# Patient Record
Sex: Male | Born: 1985 | Race: Black or African American | Hispanic: No | Marital: Married | Smoking: Never smoker
Health system: Southern US, Community
[De-identification: ages and names within clinical notes are randomized; demographics above are authoritative.]

## PROBLEM LIST (undated history)

## (undated) DIAGNOSIS — J45909 Unspecified asthma, uncomplicated: Secondary | ICD-10-CM

---

## 2019-11-18 ENCOUNTER — Other Ambulatory Visit: Payer: Self-pay

## 2019-11-18 ENCOUNTER — Emergency Department (HOSPITAL_COMMUNITY)
Admission: EM | Admit: 2019-11-18 | Discharge: 2019-11-18 | Disposition: A | Discharge status: Home / Self Care | Payer: 59 | Attending: Emergency Medicine | Admitting: Emergency Medicine

## 2019-11-18 ENCOUNTER — Emergency Department (HOSPITAL_COMMUNITY): Payer: 59

## 2019-11-18 ENCOUNTER — Encounter (HOSPITAL_COMMUNITY): Payer: Self-pay | Admitting: Emergency Medicine

## 2019-11-18 DIAGNOSIS — J45909 Unspecified asthma, uncomplicated: Secondary | ICD-10-CM | POA: Insufficient documentation

## 2019-11-18 DIAGNOSIS — J069 Acute upper respiratory infection, unspecified: Secondary | ICD-10-CM | POA: Insufficient documentation

## 2019-11-18 DIAGNOSIS — R0981 Nasal congestion: Secondary | ICD-10-CM | POA: Diagnosis present

## 2019-11-18 HISTORY — DX: Unspecified asthma, uncomplicated: J45.909

## 2019-11-18 NOTE — ED Triage Notes (Signed)
C/o runny nose, cough with yellow phlegm, and sore throat x 1 week.

## 2019-11-18 NOTE — Discharge Instructions (Signed)
Your symptoms are likely caused by a viral upper respiratory infection. Antibiotics are not helpful in treating viral infection, the virus should run its course in about 5-7 days. Please make sure you are drinking plenty of fluids. You can treat your symptoms supportively with tylenol/ibuprofen for fevers and pains, Zyrtec and Flonase to help with nasal congestion, and Mucinex or over the counter cough syrups and throat lozenges to help with cough. If your symptoms are not improving please follow up with you Primary doctor.   If you develop persistent fevers, shortness of breath or difficulty breathing, chest pain, severe headache and neck pain, persistent nausea and vomiting or other new or concerning symptoms return to the Emergency department.

## 2019-11-18 NOTE — ED Provider Notes (Signed)
[facility] EMERGENCY DEPARTMENT Provider Note   CSN: [id number] Arrival date & time: 11/18/19  1637     History Chief Complaint  Patient presents with  . Nasal Congestion  . Cough  . Sore Throat    Fadi Menter is a 34 y.o. male.  Demetrius Mahler is a 34 y.o. male with a history of asthma, who presents to the emergency department for evaluation of 1 week of rhinorrhea, nasal congestion, sore throat and cough.  Patient reports cough has been productive of clear to yellow sputum.  Feels that he has nasal drainage.  Sore throat noted as well.  No fevers or chills.  No headache or myalgias.  No chest pain or shortness of breath.  He has not noted any wheezing.  No abdominal pain, nausea or vomiting.  Patient has not had any known sick contacts, states that he has had both of these Covid vaccines, completed vaccinations about 5 weeks ago.  Has been using some over-the-counter medications without much improvement.        Past Medical History:  Diagnosis Date  . Asthma     There are no problems to display for this patient.   History reviewed. No pertinent surgical history.     No family history on file.  Social History   Tobacco Use  . Smoking status: Never Smoker  . Smokeless tobacco: Never Used  Substance Use Topics  . Alcohol use: Not Currently  . Drug use: Not Currently    Home Medications Prior to Admission medications   Not on File    Allergies    Patient has no allergy information on record.  Review of Systems   Review of Systems  Constitutional: Negative for chills and fever.  HENT: Positive for congestion, postnasal drip, rhinorrhea and sore throat.   Respiratory: Positive for cough. Negative for chest tightness, shortness of breath and wheezing.   Cardiovascular: Negative for chest pain.  Gastrointestinal: Negative for abdominal pain, constipation, diarrhea, nausea and vomiting.  Musculoskeletal: Negative for arthralgias and  myalgias.  Skin: Negative for color change and rash.  Neurological: Negative for headaches.    Physical Exam Updated Vital Signs BP (!) 139/94 (BP Location: Left Arm)   Pulse (!) 107   Temp 98.7 F (37.1 C) (Oral)   Resp 20   SpO2 96%   Physical Exam Vitals and nursing note reviewed.  Constitutional:      General: He is not in acute distress.    Appearance: He is well-developed. He is not ill-appearing or diaphoretic.     Comments: Well-appearing and in no distress  HENT:     Head: Normocephalic and atraumatic.     Nose: Congestion and rhinorrhea present.     Mouth/Throat:     Mouth: Mucous membranes are moist.     Pharynx: Oropharynx is clear. No pharyngeal swelling or oropharyngeal exudate.     Tonsils: No tonsillar exudate.     Comments: Posterior oropharynx clear and mucous membranes moist, there is mild erythema but no edema or tonsillar exudates, uvula midline, normal phonation, no trismus, tolerating secretions without difficulty. Eyes:     General:        Right eye: No discharge.        Left eye: No discharge.  Neck:     Comments: No rigidity Cardiovascular:     Rate and Rhythm: Normal rate and regular rhythm.     Heart sounds: Normal heart sounds.  Pulmonary:     Effort:  Pulmonary effort is normal. No respiratory distress.     Breath sounds: Normal breath sounds.     Comments: Respirations equal and unlabored, patient able to speak in full sentences, lungs clear to auscultation bilaterally Abdominal:     General: Bowel sounds are normal. There is no distension.     Palpations: Abdomen is soft. There is no mass.     Tenderness: There is no abdominal tenderness. There is no guarding.     Comments: Abdomen soft, nondistended, nontender to palpation in all quadrants without guarding or peritoneal signs  Musculoskeletal:        General: No deformity.     Cervical back: Neck supple.  Lymphadenopathy:     Cervical: No cervical adenopathy.  Skin:    General: Skin  is warm and dry.     Capillary Refill: Capillary refill takes less than 2 seconds.  Neurological:     Mental Status: He is alert.     ED Results / Procedures / Treatments   Labs (all labs ordered are listed, but only abnormal results are displayed) Labs Reviewed - No data to display  EKG None  Radiology No results found.  Procedures Procedures (including critical care time)  Medications Ordered in ED Medications - No data to display  ED Course  I have reviewed the triage vital signs and the nursing notes.  Pertinent labs & imaging results that were available during my care of the patient were reviewed by me and considered in my medical decision making (see chart for details).    MDM Rules/Calculators/A&P                     Pt presents with nasal congestion and cough. Pt is well appearing and vitals are normal. Lungs CTA on exam. Pt CXR negative for acute infiltrate. Patients symptoms are consistent with URI, likely viral etiology, versus seasonal allergies. Pt has had both covid vaccines, and I feel this is less likely, offered COVID testing, which pt declined. Discussed that antibiotics are not indicated for viral infections. Pt will be discharged with symptomatic treatment.  Verbalizes understanding and is agreeable with plan. Pt is hemodynamically stable & in NAD prior to dc. Return precautions discussed, pt expresses understanding and agrees with plan.  Final Clinical Impression(s) / ED Diagnoses Final diagnoses:  Viral URI with cough    Rx / DC Orders ED Discharge Orders    None       Legrand Rams 11/21/19 0107    Tilden Fossa, MD 11/22/19 1546

## 2021-09-28 IMAGING — DX DG CHEST 1V PORT
1 series · 1 of 1 positions shown · non-contrast
Comparison: None.

CLINICAL DATA: Cough and sore throat for 1 week

EXAM:
PORTABLE CHEST 1 VIEW

[chest]
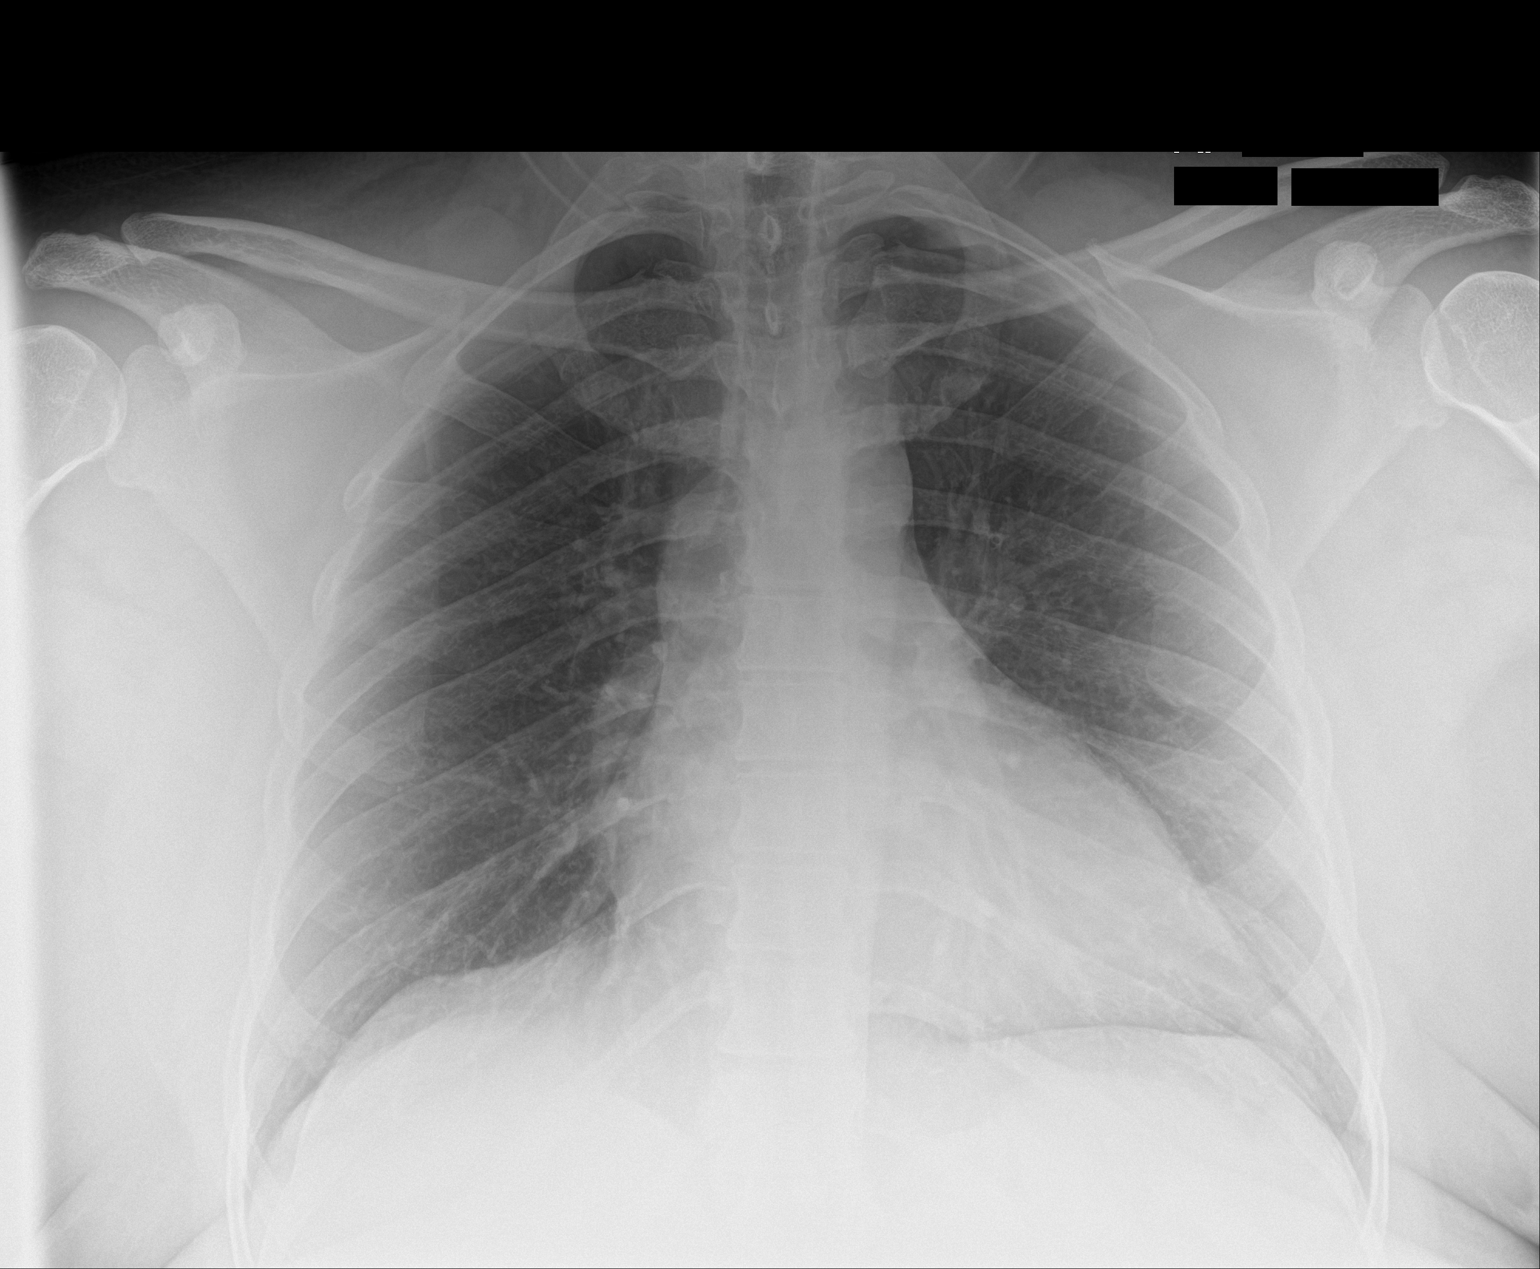

[1 of 1 positions shown; findings below may reference images not displayed]

FINDINGS: The heart size and mediastinal contours are within normal limits.
Both lungs are clear. The visualized skeletal structures are
unremarkable.
IMPRESSION: No active disease.

## 2022-03-10 ENCOUNTER — Encounter (HOSPITAL_BASED_OUTPATIENT_CLINIC_OR_DEPARTMENT_OTHER): Payer: Self-pay | Admitting: Emergency Medicine

## 2022-03-10 ENCOUNTER — Other Ambulatory Visit: Payer: Self-pay

## 2022-03-10 ENCOUNTER — Emergency Department (HOSPITAL_BASED_OUTPATIENT_CLINIC_OR_DEPARTMENT_OTHER)
Admission: EM | Admit: 2022-03-10 | Discharge: 2022-03-11 | Disposition: A | Discharge status: Home / Self Care | Payer: Self-pay | Attending: Emergency Medicine | Admitting: Emergency Medicine

## 2022-03-10 ENCOUNTER — Emergency Department (HOSPITAL_BASED_OUTPATIENT_CLINIC_OR_DEPARTMENT_OTHER): Payer: Self-pay

## 2022-03-10 DIAGNOSIS — J209 Acute bronchitis, unspecified: Secondary | ICD-10-CM | POA: Insufficient documentation

## 2022-03-10 DIAGNOSIS — J4541 Moderate persistent asthma with (acute) exacerbation: Secondary | ICD-10-CM | POA: Insufficient documentation

## 2022-03-10 DIAGNOSIS — Z20822 Contact with and (suspected) exposure to covid-19: Secondary | ICD-10-CM | POA: Insufficient documentation

## 2022-03-10 LAB — CBC WITH DIFFERENTIAL/PLATELET
Abs Immature Granulocytes: 0.03 10*3/uL (ref 0.00–0.07)
Basophils Absolute: 0.1 10*3/uL (ref 0.0–0.1)
Basophils Relative: 1 %
Eosinophils Absolute: 0.4 10*3/uL (ref 0.0–0.5)
Eosinophils Relative: 4 %
HCT: 45.3 % (ref 39.0–52.0)
Hemoglobin: 15 g/dL (ref 13.0–17.0)
Immature Granulocytes: 0 %
Lymphocytes Relative: 15 %
Lymphs Abs: 1.7 10*3/uL (ref 0.7–4.0)
MCH: 27.9 pg (ref 26.0–34.0)
MCHC: 33.1 g/dL (ref 30.0–36.0)
MCV: 84.2 fL (ref 80.0–100.0)
Monocytes Absolute: 1.3 10*3/uL — ABNORMAL HIGH (ref 0.1–1.0)
Monocytes Relative: 12 %
Neutro Abs: 7.5 10*3/uL (ref 1.7–7.7)
Neutrophils Relative %: 68 %
Platelets: 315 10*3/uL (ref 150–400)
RBC: 5.38 MIL/uL (ref 4.22–5.81)
RDW: 14.9 % (ref 11.5–15.5)
WBC: 10.9 10*3/uL — ABNORMAL HIGH (ref 4.0–10.5)
nRBC: 0 % (ref 0.0–0.2)

## 2022-03-10 LAB — RESP PANEL BY RT-PCR (FLU A&B, COVID) ARPGX2
Influenza A by PCR: NEGATIVE
Influenza B by PCR: NEGATIVE
SARS Coronavirus 2 by RT PCR: NEGATIVE

## 2022-03-10 MED ORDER — ACETAMINOPHEN 325 MG PO TABS
650.0000 mg | ORAL_TABLET | Freq: Once | ORAL | Status: AC | PRN
  Administered 2022-03-10: 650 mg via ORAL
  Filled 2022-03-10: qty 2

## 2022-03-10 MED ORDER — IPRATROPIUM-ALBUTEROL 0.5-2.5 (3) MG/3ML IN SOLN
3.0000 mL | Freq: Once | RESPIRATORY_TRACT | Status: AC
  Administered 2022-03-11: 3 mL via RESPIRATORY_TRACT
  Filled 2022-03-10: qty 3

## 2022-03-10 NOTE — ED Provider Notes (Signed)
[facility] EMERGENCY DEPARTMENT Provider Note   CSN: [id number] Arrival date & time: 03/10/22  2017     History {Add pertinent medical, surgical, social history, OB history to HPI:1} Chief Complaint  Patient presents with   Shortness of Breath    [place] Nordquist is a 36 y.o. male.  The history is provided by the patient, the spouse and medical records.  Shortness of Breath Enis Riecke is a 36 y.o. male who presents to the Emergency Department complaining of shortness of breath.  He presents the emergency department accompanied by his wife for evaluation of shortness of breath that started yesterday.  She notes that he was breathing more heavily when sleeping last night.  He reports increased sinus congestion and drainage.  No reports of fever until today.  No chest pain.  He does have a nonproductive cough.  No abdominal pain, nausea, vomiting, leg swelling or pain.  He has a history of asthma.  He did use his as needed inhaler at home with no significant change in his symptoms.  He does state that at time of ED evaluation he feels partially improved.  He states that there are some people at work that have sinus issues currently.  Wife is asymptomatic.     Home Medications Prior to Admission medications   Not on File      Allergies    Patient has no known allergies.    Review of Systems   Review of Systems  Respiratory:  Positive for shortness of breath.   All other systems reviewed and are negative.   Physical Exam Updated Vital Signs BP 125/83   Pulse 92   Temp (!) 101 F (38.3 C)   Resp 20   Ht 5\' 6"  (1.676 m)   Wt 102.1 kg   SpO2 94%   BMI 36.32 kg/m  Physical Exam Vitals and nursing note reviewed.  Constitutional:      Appearance: He is well-developed.  HENT:     Head: Normocephalic and atraumatic.  Cardiovascular:     Rate and Rhythm: Normal rate and regular rhythm.     Heart sounds: No murmur heard. Pulmonary:     Effort: Pulmonary effort is  normal. No respiratory distress.     Comments: Decreased air movement bilaterally Abdominal:     Palpations: Abdomen is soft.     Tenderness: There is no abdominal tenderness. There is no guarding or rebound.  Musculoskeletal:        General: No tenderness.     Comments: Nonpitting edema to bilateral lower extremities  Skin:    General: Skin is warm and dry.  Neurological:     Mental Status: He is alert and oriented to person, place, and time.  Psychiatric:        Behavior: Behavior normal.     ED Results / Procedures / Treatments   Labs (all labs ordered are listed, but only abnormal results are displayed) Labs Reviewed  RESP PANEL BY RT-PCR (FLU A&B, COVID) ARPGX2  CULTURE, BLOOD (ROUTINE X 2)  CULTURE, BLOOD (ROUTINE X 2)  COMPREHENSIVE METABOLIC PANEL  LACTIC ACID, PLASMA  LACTIC ACID, PLASMA  CBC WITH DIFFERENTIAL/PLATELET  PROTIME-INR  URINALYSIS, ROUTINE W REFLEX MICROSCOPIC    EKG EKG Interpretation  Date/Time:  Tuesday March 10 2022 20:31:01 EDT Ventricular Rate:  105 PR Interval:  146 QRS Duration: 78 QT Interval:  306 QTC Calculation: 404 R Axis:   -22 Text Interpretation: Sinus tachycardia Otherwise normal ECG No previous ECGs  available Confirmed by Tilden Fossa [phone]) on 03/10/2022 11:08:14 PM  Radiology DG Chest 2 View  Result Date: 03/10/2022 CLINICAL DATA:  Dyspnea EXAM: CHEST - 2 VIEW COMPARISON:  11/18/2019 FINDINGS: The heart size and mediastinal contours are within normal limits. Both lungs are clear. The visualized skeletal structures are unremarkable. IMPRESSION: No active cardiopulmonary disease. Electronically Signed   By: Helyn Numbers M.D.   On: 03/10/2022 20:41    Procedures Procedures  {Document cardiac monitor, telemetry assessment procedure when appropriate:1}  Medications Ordered in ED Medications  acetaminophen (TYLENOL) tablet 650 mg (650 mg Oral Given 03/10/22 2033)    ED Course/ Medical Decision Making/ A&P                            Medical Decision Making Amount and/or Complexity of Data Reviewed Labs: ordered. Radiology: ordered.  Risk OTC drugs.   ***  {Document critical care time when appropriate:1} {Document review of labs and clinical decision tools ie heart score, Chads2Vasc2 etc:1}  {Document your independent review of radiology images, and any outside records:1} {Document your discussion with family members, caretakers, and with consultants:1} {Document social determinants of health affecting pt's care:1} {Document your decision making why or why not admission, treatments were needed:1} Final Clinical Impression(s) / ED Diagnoses Final diagnoses:  None    Rx / DC Orders ED Discharge Orders     None

## 2022-03-10 NOTE — ED Triage Notes (Signed)
SOB x 1 hours, dry cough, congestion x 2 days. Used rescue inhaler with no improvement today

## 2022-03-11 LAB — COMPREHENSIVE METABOLIC PANEL
ALT: 30 U/L (ref 0–44)
AST: 27 U/L (ref 15–41)
Albumin: 3.7 g/dL (ref 3.5–5.0)
Alkaline Phosphatase: 64 U/L (ref 38–126)
Anion gap: 9 (ref 5–15)
BUN: 13 mg/dL (ref 6–20)
CO2: 21 mmol/L — ABNORMAL LOW (ref 22–32)
Calcium: 8.8 mg/dL — ABNORMAL LOW (ref 8.9–10.3)
Chloride: 105 mmol/L (ref 98–111)
Creatinine, Ser: 1.3 mg/dL — ABNORMAL HIGH (ref 0.61–1.24)
GFR, Estimated: >60 mL/min (ref >60)
Glucose, Bld: 107 mg/dL — ABNORMAL HIGH (ref 70–99)
Potassium: 3.5 mmol/L (ref 3.5–5.1)
Sodium: 135 mmol/L (ref 135–145)
Total Bilirubin: 0.8 mg/dL (ref 0.3–1.2)
Total Protein: 7.5 g/dL (ref 6.5–8.1)

## 2022-03-11 LAB — URINALYSIS, ROUTINE W REFLEX MICROSCOPIC
Bilirubin Urine: NEGATIVE
Glucose, UA: NEGATIVE mg/dL
Hgb urine dipstick: NEGATIVE
Ketones, ur: NEGATIVE mg/dL
Leukocytes,Ua: NEGATIVE
Nitrite: NEGATIVE
Protein, ur: NEGATIVE mg/dL
Specific Gravity, Urine: >=1.03 (ref 1.005–1.030)
pH: 5.5 (ref 5.0–8.0)

## 2022-03-11 LAB — PROTIME-INR
INR: 1 (ref 0.8–1.2)
Prothrombin Time: 13.5 seconds (ref 11.4–15.2)

## 2022-03-11 LAB — LACTIC ACID, PLASMA: Lactic Acid, Venous: 1 mmol/L (ref 0.5–1.9)

## 2022-03-11 MED ORDER — OXYMETAZOLINE HCL 0.05 % NA SOLN
1.0000 | Freq: Once | NASAL | Status: AC
  Administered 2022-03-11: 1 via NASAL
  Filled 2022-03-11: qty 30

## 2022-03-11 MED ORDER — PREDNISONE 10 MG PO TABS: 40.0000 mg | ORAL_TABLET | Freq: Every day | ORAL | 0 refills | Status: DC

## 2022-03-11 MED ORDER — AZITHROMYCIN 250 MG PO TABS
500.0000 mg | ORAL_TABLET | Freq: Once | ORAL | Status: AC
  Administered 2022-03-11: 500 mg via ORAL
  Filled 2022-03-11: qty 2

## 2022-03-11 MED ORDER — AZITHROMYCIN 250 MG PO TABS: 250.0000 mg | ORAL_TABLET | Freq: Every day | ORAL | 0 refills | Status: DC

## 2022-03-11 MED ORDER — PREDNISONE 20 MG PO TABS
40.0000 mg | ORAL_TABLET | Freq: Once | ORAL | Status: AC
  Administered 2022-03-11: 40 mg via ORAL
  Filled 2022-03-11: qty 2

## 2022-03-11 NOTE — ED Notes (Signed)
Ambulated pt from room 5 to the left and in front of the restroom and back to room 5. Pt O2 stats started out at 93% and while ambulating rose to 98. Pt denies any sob but states he still has nasal congestion

## 2022-03-16 LAB — CULTURE, BLOOD (ROUTINE X 2): Culture: NO GROWTH

## 2022-03-26 ENCOUNTER — Ambulatory Visit (INDEPENDENT_AMBULATORY_CARE_PROVIDER_SITE_OTHER): Payer: Managed Care, Other (non HMO) | Admitting: Family Medicine

## 2022-03-26 ENCOUNTER — Encounter: Payer: Self-pay | Admitting: Family Medicine

## 2022-03-26 VITALS — BP 124/96 | HR 104 | Temp 97.0°F | Ht 66.0 in | Wt 269.0 lb

## 2022-03-26 DIAGNOSIS — R0683 Snoring: Secondary | ICD-10-CM | POA: Diagnosis not present

## 2022-03-26 DIAGNOSIS — R0681 Apnea, not elsewhere classified: Secondary | ICD-10-CM

## 2022-03-26 DIAGNOSIS — J4530 Mild persistent asthma, uncomplicated: Secondary | ICD-10-CM | POA: Diagnosis not present

## 2022-03-26 DIAGNOSIS — R030 Elevated blood-pressure reading, without diagnosis of hypertension: Secondary | ICD-10-CM

## 2022-03-26 MED ORDER — SYMBICORT 160-4.5 MCG/ACT IN AERO: INHALATION_SPRAY | RESPIRATORY_TRACT | 2 refills | Status: DC

## 2022-03-26 NOTE — Patient Instructions (Signed)
You should hear from the [facility] sleep [facility] in the next week or 2.  Let us know if you do not.   Asthma, Adult  Asthma is a condition that causes swelling and narrowing of the airways. These are the passages that lead from the nose and mouth down into the lungs. When asthma symptoms get worse it is called an asthma attack or flare. This can make it hard to breathe. Asthma flares can range from minor to life-threatening. There is no cure for asthma, but medicines and lifestyle changes can help to control it. What are the causes? It is not known exactly what causes asthma, but certain things can cause asthma symptoms to get worse (triggers). What can trigger an asthma attack? Cigarette smoke. Mold. Dust. Your pet's skin flakes (dander). Cockroaches. Pollen. Air pollution (like household cleaners, wood smoke, smog, or Therapist, occupational). What are the signs or symptoms? Trouble breathing (shortness of breath). Coughing. Making high-pitched whistling sounds when you breathe, most often when you breathe out (wheezing). Chest tightness. Tiredness with little activity. Poor exercise tolerance. How is this treated? Controller medicines that help prevent asthma symptoms. Fast-acting reliever or rescue medicines. These give short-term relief of asthma symptoms. Allergy medicines if your attacks are brought on by allergens. Medicines to help control the body's defense (immune) system. Staying away from the things that cause asthma attacks. Follow these instructions at home: Avoiding triggers in your home Do not allow anyone to smoke in your home. Limit use of fireplaces and wood stoves. Get rid of pests (such as roaches and mice) and their droppings. Keep your home clean. Clean your floors. Dust regularly. Use cleaning products that do not smell. Wash bed sheets and blankets every week in hot water. Dry them in a dryer. Have someone vacuum when you are not home. Change your heating and  air conditioning filters often. Use blankets that are made of polyester or cotton. General instructions Take over-the-counter and prescription medicines only as told by your doctor. Do not smoke or use any products that contain nicotine or tobacco. If you need help quitting, ask your doctor. Stay away from secondhand smoke. Avoid doing things outdoors when allergen counts are high and when air quality is low. Warm up before you exercise. Take time to cool down after exercise. Use a peak flow meter as told by your doctor. A peak flow meter is a tool that measures how well your lungs are working. Keep track of the peak flow meter's readings. Write them down. Follow your asthma action plan. This is a written plan for taking care of your asthma and treating your attacks. Make sure you get all the shots (vaccines) that your doctor recommends. Ask your doctor about a flu shot and a pneumonia shot. Keep all follow-up visits. Contact a doctor if: You have wheezing, shortness of breath, or a cough even while taking medicine to prevent attacks. The mucus you cough up (sputum) is thicker than usual. The mucus you cough up changes from clear or white to yellow, green, gray, or is bloody. You have problems from the medicine you are taking, such as: A rash. Itching. Swelling. Trouble breathing. You need reliever medicines more than 2-3 times a week. Your peak flow reading is still at 50-79% of your personal best after following the action plan for 1 hour. You have a fever. Get help right away if: You seem to be worse and are not responding to medicine during an asthma attack. You are short  of breath even at rest. You get short of breath when doing very little activity. You have trouble eating, drinking, or talking. You have chest pain or tightness. You have a fast heartbeat. Your lips or fingernails start to turn blue. You are light-headed or dizzy, or you faint. Your peak flow is less than 50%  of your personal best. You feel too tired to breathe normally. These symptoms may be an emergency. Get help right away. Call 911. Do not wait to see if the symptoms will go away. Do not drive yourself to the hospital. Summary Asthma is a long-term (chronic) condition in which the airways get tight and narrow. An asthma attack can make it hard to breathe. Asthma cannot be cured, but medicines and lifestyle changes can help control it. Make sure you understand how to avoid triggers and how and when to use your medicines. Avoid things that can cause allergy symptoms (allergens). These include animal skin flakes (dander) and pollen from trees or grass. Avoid things that pollute the air. These may include household cleaners, wood smoke, smog, or chemical odors. This information is not intended to replace advice given to you by your health care provider. Make sure you discuss any questions you have with your health care provider. Document Revised: 04/28/2021 Document Reviewed: 04/28/2021 Elsevier Patient Education  2023 [employer].

## 2022-03-26 NOTE — Assessment & Plan Note (Signed)
Home sleep test ordered 

## 2022-03-26 NOTE — Assessment & Plan Note (Signed)
Counseling on getting a blood pressure cuff and starting to keep an eye on his blood pressure at home. Reviewed labs and notes from previous ED visit He will follow-up for a fasting CPE in the next 3 to 4 months.

## 2022-03-26 NOTE — Assessment & Plan Note (Signed)
Epworth Sleepiness Scale score 8. I suspect he may have sleep apnea and Home sleep test ordered.

## 2022-03-26 NOTE — Assessment & Plan Note (Signed)
Continue Symbicort.  Follow-up if he is needing albuterol inhaler more than twice weekly.

## 2022-03-26 NOTE — Progress Notes (Signed)
New Patient Office Visit  Subjective    Patient ID: Larry Fitzpatrick, male    DOB: September 16, 1985  Age: 36 y.o. MRN: [id number]  CC:  Chief Complaint  Patient presents with   Establish Care    Would like to discuss sleep study referral, issues with snoring.     HPI Larry Fitzpatrick presents to establish care.  His wife is with him today.  History of asthma-diagnosed at age 64.  Reports using Symbicort daily  Albuterol inhaler as needed infrequently.  Last used one week ago.   He has seen Dr. Lottie Rater, pulmonologist, in the past.   States he is snoring loudly and stops breathing per his wife.  His wife thinks he may have sleep apnea.  He has never been tested.  Denies fever, chills, dizziness, chest pain, palpitations, shortness of breath, abdominal pain, N/V/D, urinary symptoms, LE edema.   Denies history of hypertension.     Outpatient Encounter Medications as of 03/26/2022  Medication Sig   [DISCONTINUED] SYMBICORT 160-4.5 MCG/ACT inhaler SMARTSIG:2 Puff(s) By Mouth Twice Daily   SYMBICORT 160-4.5 MCG/ACT inhaler SMARTSIG:2 Puff(s) By Mouth Twice Daily   [DISCONTINUED] azithromycin (ZITHROMAX) 250 MG tablet Take 1 tablet (250 mg total) by mouth daily.   [DISCONTINUED] predniSONE (DELTASONE) 10 MG tablet Take 4 tablets (40 mg total) by mouth daily.   No facility-administered encounter medications on file as of 03/26/2022.    Past Medical History:  Diagnosis Date   Asthma     History reviewed. No pertinent surgical history.  History reviewed. No pertinent family history.  Social History   Socioeconomic History   Marital status: Married    Spouse name: Not on file   Number of children: Not on file   Years of education: Not on file   Highest education level: Not on file  Occupational History   Not on file  Tobacco Use   Smoking status: Never   Smokeless tobacco: Never  Substance and Sexual Activity   Alcohol use: Not Currently   Drug use: Not Currently   Sexual  activity: Not on file  Other Topics Concern   Not on file  Social History Narrative   Not on file   Social Determinants of Health   Financial Resource Strain: Not on file  Food Insecurity: Not on file  Transportation Needs: Not on file  Physical Activity: Not on file  Stress: Not on file  Social Connections: Not on file  Intimate Partner Violence: Not on file    ROS Pertinent positives and negatives in the history of present illness.      Objective    BP (!) 124/96 (BP Location: Left Arm, Patient Position: Sitting, Cuff Size: Large)   Pulse (!) 104   Temp (!) 97 F (36.1 C) (Temporal)   Ht 5\' 6"  (1.676 m)   Wt 269 lb (122 kg)   SpO2 95%   BMI 43.42 kg/m   Physical Exam Constitutional:      General: He is not in acute distress.    Appearance: He is not ill-appearing.  Eyes:     Conjunctiva/sclera: Conjunctivae normal.  Cardiovascular:     Rate and Rhythm: Normal rate and regular rhythm.  Pulmonary:     Effort: Pulmonary effort is normal.     Breath sounds: Normal breath sounds.  Musculoskeletal:     Cervical back: Normal range of motion.     Right lower leg: No edema.     Left lower leg: No edema.  Skin:  General: Skin is warm and dry.  Neurological:     General: No focal deficit present.     Mental Status: He is alert and oriented to person, place, and time.  Psychiatric:        Mood and Affect: Mood normal.        Behavior: Behavior normal.         Assessment & Plan:   Problem List Items Addressed This Visit       Respiratory   Mild persistent asthma without complication - Primary    Continue Symbicort.  Follow-up if he is needing albuterol inhaler more than twice weekly.      Relevant Medications   SYMBICORT 160-4.5 MCG/ACT inhaler     Other   Elevated blood pressure reading in office without diagnosis of hypertension    Counseling on getting a blood pressure cuff and starting to keep an eye on his blood pressure at home. Reviewed  labs and notes from previous ED visit He will follow-up for a fasting CPE in the next 3 to 4 months.      Loud snoring    Home sleep test ordered.      Relevant Orders   Home sleep test   Witnessed apneic spells    Epworth Sleepiness Scale score 8. I suspect he may have sleep apnea and Home sleep test ordered.      Relevant Orders   Home sleep test    Return for return for a fasting CPE in the next 3 to 4 months .   Hetty Blend, NP-C

## 2022-05-13 ENCOUNTER — Encounter (HOSPITAL_BASED_OUTPATIENT_CLINIC_OR_DEPARTMENT_OTHER): Payer: Self-pay

## 2022-05-13 ENCOUNTER — Ambulatory Visit (HOSPITAL_BASED_OUTPATIENT_CLINIC_OR_DEPARTMENT_OTHER): Payer: Managed Care, Other (non HMO) | Attending: Family Medicine | Admitting: Internal Medicine

## 2022-05-13 DIAGNOSIS — R0681 Apnea, not elsewhere classified: Secondary | ICD-10-CM

## 2022-05-13 DIAGNOSIS — R0683 Snoring: Secondary | ICD-10-CM

## 2022-05-27 ENCOUNTER — Ambulatory Visit: Payer: Managed Care, Other (non HMO) | Admitting: Family Medicine

## 2022-05-29 ENCOUNTER — Encounter (HOSPITAL_BASED_OUTPATIENT_CLINIC_OR_DEPARTMENT_OTHER): Payer: Self-pay

## 2022-05-29 ENCOUNTER — Ambulatory Visit (HOSPITAL_BASED_OUTPATIENT_CLINIC_OR_DEPARTMENT_OTHER): Payer: Managed Care, Other (non HMO) | Admitting: Internal Medicine

## 2022-06-08 ENCOUNTER — Telehealth: Payer: Self-pay | Admitting: Family Medicine

## 2022-06-08 NOTE — Telephone Encounter (Signed)
Sleep Study center called and the order for an at home sleep study will not work as the pt has had failed attempts of this and they will instead need a new order for an in lab sleep study.

## 2022-06-08 NOTE — Telephone Encounter (Signed)
Sleep Study called and is requesting that a new order be placed for the patient to have a in-lab sleep study instead of a at home sleep study order due to the patient has had 2 failed attempts with the at home sleep study. Best call back number for the office is [phone]

## 2022-06-09 ENCOUNTER — Other Ambulatory Visit: Payer: Self-pay | Admitting: Family Medicine

## 2022-06-09 DIAGNOSIS — R0683 Snoring: Secondary | ICD-10-CM

## 2022-06-09 DIAGNOSIS — R0681 Apnea, not elsewhere classified: Secondary | ICD-10-CM

## 2022-06-09 NOTE — Telephone Encounter (Signed)
Split night study

## 2022-07-24 ENCOUNTER — Ambulatory Visit (HOSPITAL_BASED_OUTPATIENT_CLINIC_OR_DEPARTMENT_OTHER): Payer: Managed Care, Other (non HMO) | Attending: Family Medicine | Admitting: Internal Medicine

## 2022-07-24 ENCOUNTER — Encounter (HOSPITAL_BASED_OUTPATIENT_CLINIC_OR_DEPARTMENT_OTHER): Payer: Self-pay

## 2022-08-18 ENCOUNTER — Ambulatory Visit (HOSPITAL_BASED_OUTPATIENT_CLINIC_OR_DEPARTMENT_OTHER): Payer: Self-pay | Admitting: Internal Medicine

## 2022-08-18 VITALS — Ht 65.0 in | Wt 239.0 lb

## 2022-08-20 ENCOUNTER — Ambulatory Visit (HOSPITAL_BASED_OUTPATIENT_CLINIC_OR_DEPARTMENT_OTHER): Payer: Self-pay | Attending: Family Medicine | Admitting: Internal Medicine

## 2022-08-20 VITALS — Ht 65.0 in | Wt 239.0 lb

## 2022-08-20 DIAGNOSIS — R0683 Snoring: Secondary | ICD-10-CM

## 2022-08-20 DIAGNOSIS — R0681 Apnea, not elsewhere classified: Secondary | ICD-10-CM

## 2022-08-29 DIAGNOSIS — R0683 Snoring: Secondary | ICD-10-CM

## 2022-08-29 DIAGNOSIS — R0681 Apnea, not elsewhere classified: Secondary | ICD-10-CM

## 2022-08-29 NOTE — Procedures (Signed)
    Patient Name: Larry Fitzpatrick, Larry Fitzpatrick Date: 08/24/2022 Gender: Male D.O.B: March 13, 1986 Age (years): 36 Referring Provider: Girtha Rm NP Height (inches): 53 Interpreting Physician: Baird Lyons MD, ABSM Weight (lbs): 239 RPSGT: Jacolyn Reedy BMI: 40 MRN: [id number] Neck Size: 17.00  CLINICAL INFORMATION Sleep Study Type: HST Indication for sleep study: OSA Epworth Sleepiness Score: 8  SLEEP STUDY TECHNIQUE A multi-channel overnight portable sleep study was performed. The channels recorded were: nasal airflow, thoracic respiratory movement, and oxygen saturation with a pulse oximetry. Snoring was also monitored.  MEDICATIONS Patient self administered medications include: N/A.  SLEEP ARCHITECTURE Patient was studied for 407.2 minutes. The sleep efficiency was 100.0 % and the patient was supine for 0%. The arousal index was 0.0 per hour.  RESPIRATORY PARAMETERS The overall AHI was 30.2 per hour, with a central apnea index of 0 per hour. The oxygen nadir was 76% during sleep.  CARDIAC DATA Mean heart rate during sleep was 78.5 bpm.  IMPRESSIONS - Severe obstructive sleep apnea occurred during this study (AHI = 30.2/h). - Oxygen desaturation was noted during this study (Min O2 = 76%, Mean 96%). - Patient snored.  DIAGNOSIS - Obstructive Sleep Apnea (G47.33)  RECOMMENDATIONS - Suggest CPAP titration sleep study or autopap. Other options would be based on clinical judgment. - Be careful with alcohol, sedatives and other CNS depressants that may worsen sleep apnea and disrupt normal sleep architecture. - Sleep hygiene should be reviewed to assess factors that may improve sleep quality. - Weight management and regular exercise should be initiated or continued.  [Electronically signed] 08/29/2022 10:25 AM  Baird Lyons MD, ABSM Diplomate, American Board of Sleep Medicine NPI: [id number]                         [place]  [facility]  ELECTRONICALLY SIGNED ON:  08/29/2022, 10:22 AM [place] PH: [phone]   FX: [phone] [place]

## 2022-08-31 ENCOUNTER — Other Ambulatory Visit: Payer: Self-pay

## 2022-08-31 DIAGNOSIS — G4733 Obstructive sleep apnea (adult) (pediatric): Secondary | ICD-10-CM

## 2022-08-31 NOTE — Progress Notes (Signed)
CPAP order placed

## 2022-10-27 ENCOUNTER — Encounter: Payer: Self-pay | Admitting: Family Medicine

## 2022-11-12 NOTE — Telephone Encounter (Signed)
I do not see that the sleep study has been resulted yet, does the "in process" mean they are still working on resulting it?

## 2022-12-16 NOTE — Telephone Encounter (Signed)
Called pt and he reports he has still not heard anything in regards to CPAP machine. Advised pt I will get in contact with [facility] and update him when I hear back.

## 2022-12-16 NOTE — Telephone Encounter (Signed)
Patient called to follow up on an order for a C-Pap. He would like a call back at [phone].

## 2022-12-17 NOTE — Telephone Encounter (Signed)
Larry Fitzpatrick from [facility] replied and they confirmed they received DME CPAP order, they will be working on it

## 2022-12-23 NOTE — Telephone Encounter (Signed)
Provider has received paperwork and signed off on supplies. Faxed to supply company, confirmation fax received.

## 2023-06-16 ENCOUNTER — Other Ambulatory Visit: Payer: Self-pay | Admitting: Family Medicine

## 2023-06-16 ENCOUNTER — Encounter: Payer: Self-pay | Admitting: Family Medicine

## 2023-06-16 ENCOUNTER — Ambulatory Visit: Payer: BC Managed Care – PPO | Admitting: Family Medicine

## 2023-06-16 VITALS — BP 132/86 | HR 88 | Temp 97.8°F | Ht 65.0 in | Wt 285.0 lb

## 2023-06-16 DIAGNOSIS — R5383 Other fatigue: Secondary | ICD-10-CM | POA: Diagnosis not present

## 2023-06-16 DIAGNOSIS — G4733 Obstructive sleep apnea (adult) (pediatric): Secondary | ICD-10-CM | POA: Insufficient documentation

## 2023-06-16 DIAGNOSIS — R400 Somnolence: Secondary | ICD-10-CM

## 2023-06-16 DIAGNOSIS — R030 Elevated blood-pressure reading, without diagnosis of hypertension: Secondary | ICD-10-CM | POA: Diagnosis not present

## 2023-06-16 DIAGNOSIS — J4530 Mild persistent asthma, uncomplicated: Secondary | ICD-10-CM

## 2023-06-16 LAB — HEMOGLOBIN A1C: Hgb A1c MFr Bld: 6.4 % (ref 4.6–6.5)

## 2023-06-16 LAB — URINALYSIS, ROUTINE W REFLEX MICROSCOPIC
Bilirubin Urine: NEGATIVE
Hgb urine dipstick: NEGATIVE
Ketones, ur: NEGATIVE
Leukocytes,Ua: NEGATIVE
Nitrite: NEGATIVE
RBC / HPF: NONE SEEN (ref 0–?)
Specific Gravity, Urine: 1.025 (ref 1.000–1.030)
Total Protein, Urine: NEGATIVE
Urine Glucose: NEGATIVE
Urobilinogen, UA: 0.2 (ref 0.0–1.0)
pH: 6 (ref 5.0–8.0)

## 2023-06-16 LAB — CBC WITH DIFFERENTIAL/PLATELET
Basophils Absolute: 0.1 10*3/uL (ref 0.0–0.1)
Basophils Relative: 0.8 % (ref 0.0–3.0)
Eosinophils Absolute: 0.2 10*3/uL (ref 0.0–0.7)
Eosinophils Relative: 3.5 % (ref 0.0–5.0)
HCT: 47.5 % (ref 39.0–52.0)
Hemoglobin: 15.3 g/dL (ref 13.0–17.0)
Lymphocytes Relative: 31.7 % (ref 12.0–46.0)
Lymphs Abs: 2 10*3/uL (ref 0.7–4.0)
MCHC: 32.3 g/dL (ref 30.0–36.0)
MCV: 83.8 fL (ref 78.0–100.0)
Monocytes Absolute: 0.6 10*3/uL (ref 0.1–1.0)
Monocytes Relative: 9.6 % (ref 3.0–12.0)
Neutro Abs: 3.5 10*3/uL (ref 1.4–7.7)
Neutrophils Relative %: 54.4 % (ref 43.0–77.0)
Platelets: 352 10*3/uL (ref 150.0–400.0)
RBC: 5.67 Mil/uL (ref 4.22–5.81)
RDW: 16.4 % — ABNORMAL HIGH (ref 11.5–15.5)
WBC: 6.4 10*3/uL (ref 4.0–10.5)

## 2023-06-16 LAB — LIPID PANEL
Cholesterol: 154 mg/dL (ref 0–200)
HDL: 46.7 mg/dL (ref >39.00)
LDL Cholesterol: 87 mg/dL (ref 0–99)
NonHDL: 107.6
Total CHOL/HDL Ratio: 3
Triglycerides: 105 mg/dL (ref 0.0–149.0)
VLDL: 21 mg/dL (ref 0.0–40.0)

## 2023-06-16 LAB — COMPREHENSIVE METABOLIC PANEL
ALT: 31 U/L (ref 0–53)
AST: 28 U/L (ref 0–37)
Albumin: 4.3 g/dL (ref 3.5–5.2)
Alkaline Phosphatase: 76 U/L (ref 39–117)
BUN: 18 mg/dL (ref 6–23)
CO2: 25 meq/L (ref 19–32)
Calcium: 9.6 mg/dL (ref 8.4–10.5)
Chloride: 104 meq/L (ref 96–112)
Creatinine, Ser: 1.27 mg/dL (ref 0.40–1.50)
GFR: 72.12 mL/min (ref >60.00)
Glucose, Bld: 94 mg/dL (ref 70–99)
Potassium: 4.1 meq/L (ref 3.5–5.1)
Sodium: 138 meq/L (ref 135–145)
Total Bilirubin: 0.4 mg/dL (ref 0.2–1.2)
Total Protein: 7.7 g/dL (ref 6.0–8.3)

## 2023-06-16 LAB — VITAMIN B12: Vitamin B-12: 315 pg/mL (ref 211–911)

## 2023-06-16 LAB — VITAMIN D 25 HYDROXY (VIT D DEFICIENCY, FRACTURES): VITD: 25.05 ng/mL — ABNORMAL LOW (ref 30.00–100.00)

## 2023-06-16 LAB — T4, FREE: Free T4: 0.95 ng/dL (ref 0.60–1.60)

## 2023-06-16 LAB — FERRITIN: Ferritin: 118.6 ng/mL (ref 22.0–322.0)

## 2023-06-16 LAB — TSH: TSH: 1.09 u[IU]/mL (ref 0.35–5.50)

## 2023-06-16 MED ORDER — SYMBICORT 160-4.5 MCG/ACT IN AERO: INHALATION_SPRAY | RESPIRATORY_TRACT | 2 refills | Status: DC

## 2023-06-16 MED ORDER — ALBUTEROL SULFATE HFA 108 (90 BASE) MCG/ACT IN AERS: 2.0000 | INHALATION_SPRAY | Freq: Four times a day (QID) | RESPIRATORY_TRACT | 0 refills | Status: DC | PRN

## 2023-06-16 NOTE — Patient Instructions (Signed)
Please go downstairs for labs and a urine test before you leave.  Use Symbicort daily as prescribed.  Let me know if you are needing your albuterol inhaler (rescue inhaler) more than twice weekly.  I have ordered a CPAP machine and you should hear about this soon.  Let me know if you have not heard in the next week.  I encourage you to cut back on calories and carbohydrates such as potatoes, bread, pasta and rice.  I also recommend that you cut back on sugar.  Your blood pressure is elevated.  Please purchase a blood pressure machine and start checking your blood pressure at home. Goal blood pressure readings are 130/80 or lower.  Please see the DASH diet  Follow-up here in 4 to 6 weeks.  Bring in any blood pressure readings

## 2023-06-16 NOTE — Assessment & Plan Note (Signed)
Most likely multifactorial.  Discussed the need to start treating his sleep apnea.  CPAP ordered.  Discussed cutting back on calories and carbohydrates for weight loss.  Check labs to look for underlying etiologies.

## 2023-06-16 NOTE — Telephone Encounter (Signed)
Brand name not covered, ok for switch?

## 2023-06-16 NOTE — Assessment & Plan Note (Signed)
Restart Symbicort.  Follow-up if he is needing albuterol inhaler more than twice weekly.

## 2023-06-16 NOTE — Assessment & Plan Note (Addendum)
Counseling on getting a blood pressure cuff and starting to keep an eye on his blood pressure at home. DASH diet handout provided. Check renal function as well as possible secondary causes for HTN. Untreated OSA is most likely contributing.  Follow up here in 4-6 wks.

## 2023-06-16 NOTE — Assessment & Plan Note (Signed)
Reportedly due to overeating and sedentary lifestyle.  Check labs to look for potential complications related to obesity.  Encouraged him to cut back on calories and carbohydrates specifically.  Follow-up in 4 weeks.

## 2023-06-16 NOTE — Progress Notes (Signed)
Subjective:     Patient ID: Larry Fitzpatrick, male    DOB: 1986/02/24, 37 y.o.   MRN: [id number]  Chief Complaint  Patient presents with   Medication Refill    Also wants new CPAP order    Medication Refill Pertinent negatives include no abdominal pain, chest pain, chills, fever, headaches, nausea or vomiting.     History of Present Illness         He is here for follow up on asthma and OSA. I saw him once in August 2023 and none since.   He had a sleep test that showed OSA and he never used a CPAP.  States he was not able to afford it initially and now he would like for me to order any CPAP machine.  Complains of fatigue.  States he fell asleep sitting up the other day.  Asthma- states he ran out of his inhalers.  Denies any urgent care or ED visits  He has had several elevated BP readings in the past.   He has gained a significant amount of weight since our last visit.  States he is sedentary and overeats.   Diabetes in the family.    Married now.  Works at an [employer] call center.   Health Maintenance Due  Topic Date Due   HIV Screening  Never done   Hepatitis C Screening  Never done   DTaP/Tdap/Td (1 - Tdap) Never done   INFLUENZA VACCINE  Never done    Past Medical History:  Diagnosis Date   Asthma     History reviewed. No pertinent surgical history.  History reviewed. No pertinent family history.  Social History   Socioeconomic History   Marital status: Married    Spouse name: Not on file   Number of children: Not on file   Years of education: Not on file   Highest education level: Not on file  Occupational History   Not on file  Tobacco Use   Smoking status: Never   Smokeless tobacco: Never  Substance and Sexual Activity   Alcohol use: Not Currently   Drug use: Not Currently   Sexual activity: Not on file  Other Topics Concern   Not on file  Social History Narrative   Not on file   Social Determinants of Health   Financial Resource  Strain: Not on file  Food Insecurity: Not on file  Transportation Needs: Not on file  Physical Activity: Not on file  Stress: Not on file  Social Connections: Not on file  Intimate Partner Violence: Not on file    Outpatient Medications Prior to Visit  Medication Sig Dispense Refill   mometasone-formoterol (DULERA) 100-5 MCG/ACT AERO Inhale into the lungs. (Patient not taking: Reported on 06/16/2023)     SYMBICORT 160-4.5 MCG/ACT inhaler SMARTSIG:2 Puff(s) By Mouth Twice Daily (Patient not taking: Reported on 06/16/2023) 1 each 2   No facility-administered medications prior to visit.    Allergies  Allergen Reactions   Other Rash    Nickle     Review of Systems  Constitutional:  Positive for malaise/fatigue. Negative for chills and fever.  Eyes:  Negative for blurred vision and double vision.  Respiratory:  Negative for shortness of breath.   Cardiovascular:  Negative for chest pain, palpitations and leg swelling.  Gastrointestinal:  Negative for abdominal pain, constipation, diarrhea, nausea and vomiting.  Genitourinary:  Negative for dysuria, frequency and urgency.  Neurological:  Negative for dizziness, focal weakness and headaches.  Endo/Heme/Allergies:  Negative  for polydipsia.  Psychiatric/Behavioral:  Negative for depression. The patient is not nervous/anxious.        Objective:    Physical Exam Constitutional:      General: He is not in acute distress.    Appearance: He is obese. He is not ill-appearing.  HENT:     Mouth/Throat:     Mouth: Mucous membranes are moist.  Eyes:     Extraocular Movements: Extraocular movements intact.     Conjunctiva/sclera: Conjunctivae normal.  Cardiovascular:     Rate and Rhythm: Normal rate and regular rhythm.  Pulmonary:     Effort: Pulmonary effort is normal.     Breath sounds: Normal breath sounds.  Musculoskeletal:     Cervical back: Normal range of motion and neck supple.     Right lower leg: No edema.     Left lower  leg: No edema.  Skin:    General: Skin is warm and dry.  Neurological:     General: No focal deficit present.     Mental Status: He is alert and oriented to person, place, and time.     Motor: No weakness.     Coordination: Coordination normal.     Gait: Gait normal.  Psychiatric:        Mood and Affect: Mood normal.        Behavior: Behavior normal.        Thought Content: Thought content normal.      BP 132/86 (BP Location: Left Arm, Patient Position: Sitting, Cuff Size: Large)   Pulse 88   Temp 97.8 F (36.6 C) (Temporal)   Ht 5\' 5"  (1.651 m)   Wt 285 lb (129.3 kg)   SpO2 96%   BMI 47.43 kg/m  Wt Readings from Last 3 Encounters:  06/16/23 285 lb (129.3 kg)  08/20/22 239 lb (108.4 kg)  08/18/22 239 lb (108.4 kg)       Assessment & Plan:   Problem List Items Addressed This Visit       Respiratory   Mild persistent asthma without complication - Primary    Restart Symbicort.  Follow-up if he is needing albuterol inhaler more than twice weekly.      Relevant Medications   SYMBICORT 160-4.5 MCG/ACT inhaler   albuterol (VENTOLIN HFA) 108 (90 Base) MCG/ACT inhaler   Obstructive sleep apnea syndrome    Diagnosed with OSA earlier this year and he has not started using CPAP due to financial concerns. Reorder DME today.       Relevant Orders   For home use only DME continuous positive airway pressure (CPAP)     Other   Elevated blood pressure reading in office without diagnosis of hypertension    Counseling on getting a blood pressure cuff and starting to keep an eye on his blood pressure at home. DASH diet handout provided. Check renal function as well as possible secondary causes for HTN. Untreated OSA is most likely contributing.  Follow up here in 4-6 wks.       Relevant Orders   Urinalysis, Routine w reflex microscopic   Fatigue    Most likely multifactorial.  Discussed the need to start treating his sleep apnea.  CPAP ordered.  Discussed cutting back on  calories and carbohydrates for weight loss.  Check labs to look for underlying etiologies.      Relevant Orders   CBC with Differential/Platelet   Comprehensive metabolic panel   TSH   T4, free   Vitamin B12  VITAMIN D 25 Hydroxy (Vit-D Deficiency, Fractures)   Ferritin   Urinalysis, Routine w reflex microscopic   Severe obesity (BMI >= 40) (HCC)    Reportedly due to overeating and sedentary lifestyle.  Check labs to look for potential complications related to obesity.  Encouraged him to cut back on calories and carbohydrates specifically.  Follow-up in 4 weeks.      Relevant Orders   CBC with Differential/Platelet   Comprehensive metabolic panel   TSH   T4, free   Lipid panel   Hemoglobin A1c   Urinalysis, Routine w reflex microscopic   Other Visit Diagnoses     Daytime somnolence           I have discontinued Lennex Zeiner's Dulera. I am also having him start on albuterol. Additionally, I am having him maintain his Symbicort.  Meds ordered this encounter  Medications   SYMBICORT 160-4.5 MCG/ACT inhaler    Sig: SMARTSIG:2 Puff(s) By Mouth Twice Daily    Dispense:  1 each    Refill:  2   albuterol (VENTOLIN HFA) 108 (90 Base) MCG/ACT inhaler    Sig: Inhale 2 puffs into the lungs every 6 (six) hours as needed for wheezing or shortness of breath.    Dispense:  8 g    Refill:  0    Order Specific Question:   Supervising Provider    Answer:   Hillard Danker A [[id number]]

## 2023-06-16 NOTE — Assessment & Plan Note (Signed)
Diagnosed with OSA earlier this year and he has not started using CPAP due to financial concerns. Reorder DME today.

## 2023-06-18 ENCOUNTER — Telehealth: Payer: Self-pay | Admitting: Family Medicine

## 2023-06-18 NOTE — Telephone Encounter (Signed)
Patient returned Larry Fitzpatrick's call about his lab results. He said he is unable to answer his phone due to being at work. If there is further information, he requested it be sent via MyChart.

## 2023-06-18 NOTE — Progress Notes (Signed)
His vitamin D is low. I recommend taking over the counter vitamin D3 1,000 IUs daily.  He also has prediabetes and is very close to having diabetes. Cut back on sugar and carbohydrates such as potatoes, bread, pasta, rice. Increase physical activity. We can discuss this further at his follow up in 4 weeks.

## 2023-06-21 ENCOUNTER — Other Ambulatory Visit: Payer: Self-pay

## 2023-06-21 DIAGNOSIS — J4530 Mild persistent asthma, uncomplicated: Secondary | ICD-10-CM

## 2023-06-21 MED ORDER — FLUTICASONE-SALMETEROL 250-50 MCG/ACT IN AEPB: 1.0000 | INHALATION_SPRAY | Freq: Two times a day (BID) | RESPIRATORY_TRACT | 0 refills | Status: DC

## 2023-07-08 ENCOUNTER — Other Ambulatory Visit: Payer: Self-pay | Admitting: Family Medicine

## 2023-07-08 DIAGNOSIS — J4530 Mild persistent asthma, uncomplicated: Secondary | ICD-10-CM

## 2023-07-14 ENCOUNTER — Ambulatory Visit: Payer: BC Managed Care – PPO | Admitting: Family Medicine

## 2023-07-15 ENCOUNTER — Ambulatory Visit: Payer: BC Managed Care – PPO | Admitting: Family Medicine

## 2023-07-15 ENCOUNTER — Encounter: Payer: Self-pay | Admitting: Family Medicine

## 2023-07-15 VITALS — BP 140/92 | HR 95 | Temp 97.6°F | Ht 65.0 in | Wt 284.0 lb

## 2023-07-15 DIAGNOSIS — G4733 Obstructive sleep apnea (adult) (pediatric): Secondary | ICD-10-CM | POA: Diagnosis not present

## 2023-07-15 DIAGNOSIS — J4530 Mild persistent asthma, uncomplicated: Secondary | ICD-10-CM | POA: Diagnosis not present

## 2023-07-15 DIAGNOSIS — R7303 Prediabetes: Secondary | ICD-10-CM | POA: Diagnosis not present

## 2023-07-15 DIAGNOSIS — R030 Elevated blood-pressure reading, without diagnosis of hypertension: Secondary | ICD-10-CM

## 2023-07-15 MED ORDER — FLUTICASONE-SALMETEROL 250-50 MCG/ACT IN AEPB: 1.0000 | INHALATION_SPRAY | Freq: Two times a day (BID) | RESPIRATORY_TRACT | 0 refills | Status: AC

## 2023-07-15 NOTE — Assessment & Plan Note (Signed)
In-depth counseling done on prediabetes and the diabetes spectrum.  Counseling on healthy diet and increasing physical activity.  Counseling on standards of care in patients with diabetes and how that changes from someone with only prediabetes.  Currently A1c is 6.4%.  He will work on diet and exercise and follow-up in 3 months.

## 2023-07-15 NOTE — Patient Instructions (Addendum)
Start taking an additional vitamin D3 1,000 IUs daily. Continue your multi vitamin.   Look for ways to cut back on sugar and carbohydrates.   Look for ways to increase your physical activity.   Start using the CPAP machine and this will help with your blood pressure, energy level, blood sugars and overall health.   Follow up with me in 3 months or sooner if needed.

## 2023-07-15 NOTE — Assessment & Plan Note (Signed)
Discussed ways to eat a healthier diet and increase physical activity.  I suspect he will benefit from starting CPAP machine for untreated sleep apnea.  Follow-up in 3 months

## 2023-07-15 NOTE — Assessment & Plan Note (Signed)
Untreated OSA is most likely contributing.  He will start using his CPAP machine tonight.  Eat a low-sodium diet.  Recommend monitoring blood pressure at home. Follow up here in 3 months

## 2023-07-15 NOTE — Progress Notes (Signed)
Subjective:     Patient ID: Larry Fitzpatrick, male    DOB: 12/17/1985, 37 y.o.   MRN: [id number]  Chief Complaint  Patient presents with   Medical Management of Chronic Issues    4 week f/u, discuss prediabetes    HPI  History of Present Illness         Here to follow up on recent abnormal lab values including prediabetes with A1c 6.4%.   Low vitamin D.   Elevated BP  He and his wife fast on Saturdays. States he has started watching portion sizes and his wife is packing his lunch.   Family hx of DM  Health Maintenance Due  Topic Date Due   HIV Screening  Never done   Hepatitis C Screening  Never done   DTaP/Tdap/Td (1 - Tdap) Never done   INFLUENZA VACCINE  Never done   COVID-19 Vaccine (1 - 2024-25 season) Never done    Past Medical History:  Diagnosis Date   Asthma     History reviewed. No pertinent surgical history.  History reviewed. No pertinent family history.  Social History   Socioeconomic History   Marital status: Married    Spouse name: Not on file   Number of children: Not on file   Years of education: Not on file   Highest education level: Not on file  Occupational History   Not on file  Tobacco Use   Smoking status: Never   Smokeless tobacco: Never  Substance and Sexual Activity   Alcohol use: Not Currently   Drug use: Not Currently   Sexual activity: Not on file  Other Topics Concern   Not on file  Social History Narrative   Not on file   Social Drivers of Health   Financial Resource Strain: Not on file  Food Insecurity: Not on file  Transportation Needs: Not on file  Physical Activity: Not on file  Stress: Not on file  Social Connections: Not on file  Intimate Partner Violence: Not on file    Outpatient Medications Prior to Visit  Medication Sig Dispense Refill   albuterol (VENTOLIN HFA) 108 (90 Base) MCG/ACT inhaler TAKE 2 PUFFS BY MOUTH EVERY 6 HOURS AS NEEDED FOR WHEEZE OR SHORTNESS OF BREATH 8 g 0    fluticasone-salmeterol (WIXELA INHUB) 250-50 MCG/ACT AEPB Inhale 1 puff into the lungs in the morning and at bedtime. 60 each 0   No facility-administered medications prior to visit.    Allergies  Allergen Reactions   Other Rash    Nickle     Review of Systems  Constitutional:  Positive for malaise/fatigue. Negative for chills, fever and weight loss.  Respiratory:  Negative for shortness of breath.   Cardiovascular:  Negative for chest pain, palpitations and leg swelling.  Gastrointestinal:  Negative for abdominal pain, constipation, diarrhea, nausea and vomiting.  Genitourinary:  Negative for dysuria, frequency and urgency.  Neurological:  Negative for dizziness and focal weakness.       Objective:    Physical Exam Constitutional:      General: He is not in acute distress.    Appearance: He is obese. He is not ill-appearing.  Eyes:     Extraocular Movements: Extraocular movements intact.     Conjunctiva/sclera: Conjunctivae normal.  Cardiovascular:     Rate and Rhythm: Normal rate.  Pulmonary:     Effort: Pulmonary effort is normal.  Musculoskeletal:     Cervical back: Normal range of motion and neck supple.  Skin:  General: Skin is warm and dry.  Neurological:     General: No focal deficit present.     Mental Status: He is alert and oriented to person, place, and time.  Psychiatric:        Mood and Affect: Mood normal.        Behavior: Behavior normal.        Thought Content: Thought content normal.      BP (!) 140/92 (BP Location: Left Arm, Patient Position: Sitting, Cuff Size: Large)   Pulse 95   Temp 97.6 F (36.4 C) (Temporal)   Ht 5\' 5"  (1.651 m)   Wt 284 lb (128.8 kg)   SpO2 96%   BMI 47.26 kg/m  Wt Readings from Last 3 Encounters:  07/15/23 284 lb (128.8 kg)  06/16/23 285 lb (129.3 kg)  08/20/22 239 lb (108.4 kg)       Assessment & Plan:   Problem List Items Addressed This Visit     Elevated blood pressure reading in office without  diagnosis of hypertension   Untreated OSA is most likely contributing.  He will start using his CPAP machine tonight.  Eat a low-sodium diet.  Recommend monitoring blood pressure at home. Follow up here in 3 months      Mild persistent asthma without complication   Improving      Relevant Medications   fluticasone-salmeterol (WIXELA INHUB) 250-50 MCG/ACT AEPB   Obstructive sleep apnea syndrome   Diagnosed with OSA earlier this year and did not start using CPAP due to financial concerns. Reports he picked up his CPAP machine this morning.  He plans to start using this tonight.  Discussed the importance of treating sleep apnea.      Prediabetes - Primary   In-depth counseling done on prediabetes and the diabetes spectrum.  Counseling on healthy diet and increasing physical activity.  Counseling on standards of care in patients with diabetes and how that changes from someone with only prediabetes.  Currently A1c is 6.4%.  He will work on diet and exercise and follow-up in 3 months.      Severe obesity (BMI >= 40) (HCC)   Discussed ways to eat a healthier diet and increase physical activity.  I suspect he will benefit from starting CPAP machine for untreated sleep apnea.  Follow-up in 3 months       Visit time 22 minutes in face to face communication with patient and coordination of care, additional 10 minutes spent in record review, coordination or care, ordering tests, communicating/referring to other healthcare professionals, documenting in medical records all on the same day of the visit for total time 32 minutes spent on the visit.    I am having Larry Fitzpatrick maintain his albuterol and fluticasone-salmeterol.  Meds ordered this encounter  Medications   fluticasone-salmeterol (WIXELA INHUB) 250-50 MCG/ACT AEPB    Sig: Inhale 1 puff into the lungs in the morning and at bedtime.    Dispense:  60 each    Refill:  0

## 2023-07-15 NOTE — Assessment & Plan Note (Addendum)
Diagnosed with OSA earlier this year and did not start using CPAP due to financial concerns. Reports he picked up his CPAP machine this morning.  He plans to start using this tonight.  Discussed the importance of treating sleep apnea.

## 2023-07-15 NOTE — Assessment & Plan Note (Signed)
Improving.

## 2023-08-02 ENCOUNTER — Other Ambulatory Visit: Payer: Self-pay | Admitting: Family Medicine

## 2023-08-02 DIAGNOSIS — J4530 Mild persistent asthma, uncomplicated: Secondary | ICD-10-CM

## 2023-08-15 DIAGNOSIS — G4733 Obstructive sleep apnea (adult) (pediatric): Secondary | ICD-10-CM | POA: Diagnosis not present

## 2023-09-13 ENCOUNTER — Encounter: Payer: Self-pay | Admitting: Emergency Medicine

## 2023-09-13 ENCOUNTER — Ambulatory Visit
Admission: EM | Admit: 2023-09-13 | Discharge: 2023-09-13 | Disposition: A | Discharge status: Home / Self Care | Payer: BC Managed Care – PPO | Attending: Physician Assistant | Admitting: Physician Assistant

## 2023-09-13 DIAGNOSIS — J069 Acute upper respiratory infection, unspecified: Secondary | ICD-10-CM | POA: Diagnosis not present

## 2023-09-13 LAB — POC COVID19/FLU A&B COMBO
Covid Antigen, POC: NEGATIVE
Influenza A Antigen, POC: NEGATIVE
Influenza B Antigen, POC: NEGATIVE

## 2023-09-13 NOTE — ED Triage Notes (Signed)
 Pt reports nasal congestion and dry cough x3 days. Denies fevers at home. Pt has been taking theraflu at home with some relief.

## 2023-09-13 NOTE — ED Provider Notes (Signed)
 [facility]    CSN: [id number] Arrival date & time: 09/13/23  1440      History   Chief Complaint Chief Complaint  Patient presents with   Nasal Congestion   Cough    HPI Huan Imbert is a 38 y.o. male.   Patient here today for evaluation of nasal congestion and dry cough he said for 3 days.  He has not any fever.  He denies any sore throat.  He has been taking over-the-counter medication and using his inhaler with some relief.  The history is provided by the patient.  Cough Associated symptoms: no chills, no ear pain, no eye discharge, no fever, no shortness of breath and no sore throat     Past Medical History:  Diagnosis Date   Asthma     Patient Active Problem List   Diagnosis Date Noted   Prediabetes 07/15/2023   Severe obesity (BMI >= 40) (HCC) 06/16/2023   Obstructive sleep apnea syndrome 06/16/2023   Fatigue 06/16/2023   Mild persistent asthma without complication 03/26/2022   Witnessed apneic spells 03/26/2022   Loud snoring 03/26/2022   Elevated blood pressure reading in office without diagnosis of hypertension 03/26/2022    History reviewed. No pertinent surgical history.     Home Medications    Prior to Admission medications   Medication Sig Start Date End Date Taking? Authorizing Provider  albuterol  (VENTOLIN  HFA) 108 (90 Base) MCG/ACT inhaler TAKE 2 PUFFS BY MOUTH EVERY 6 HOURS AS NEEDED FOR WHEEZE OR SHORTNESS OF BREATH 08/02/23  Yes Henson, Vickie L, NP-C  fluticasone -salmeterol (WIXELA INHUB) 250-50 MCG/ACT AEPB Inhale 1 puff into the lungs in the morning and at bedtime. 07/15/23  Yes Henson, Marcos Sevin, NP-C    Family History Family History  Problem Relation Age of Onset   Crohn's disease Mother    Iron deficiency Mother     Social History Social History   Tobacco Use   Smoking status: Never   Smokeless tobacco: Never  Vaping Use   Vaping status: Never Used  Substance Use Topics   Alcohol use: Not Currently   Drug  use: Not Currently     Allergies   Other   Review of Systems Review of Systems  Constitutional:  Negative for chills and fever.  HENT:  Positive for congestion. Negative for ear pain and sore throat.   Eyes:  Negative for discharge and redness.  Respiratory:  Positive for cough. Negative for shortness of breath.   Gastrointestinal:  Negative for abdominal pain, nausea and vomiting.     Physical Exam Triage Vital Signs ED Triage Vitals  Encounter Vitals Group     BP 09/13/23 1501 122/85     Systolic BP Percentile --      Diastolic BP Percentile --      Pulse Rate 09/13/23 1457 96     Resp 09/13/23 1457 18     Temp 09/13/23 1457 98 F (36.7 C)     Temp Source 09/13/23 1457 Oral     SpO2 09/13/23 1457 96 %     Weight --      Height --      Head Circumference --      Peak Flow --      Pain Score 09/13/23 1457 1     Pain Loc --      Pain Education --      Exclude from Growth Chart --    No data found.  Updated Vital Signs BP 122/85 (  BP Location: Left Arm)   Pulse 83   Temp 98 F (36.7 C) (Oral)   Resp 16   SpO2 96%   Visual Acuity Right Eye Distance:   Left Eye Distance:   Bilateral Distance:    Right Eye Near:   Left Eye Near:    Bilateral Near:     Physical Exam Vitals and nursing note reviewed.  Constitutional:      General: He is not in acute distress.    Appearance: Normal appearance. He is not ill-appearing.  HENT:     Head: Normocephalic and atraumatic.     Nose: Congestion present.     Mouth/Throat:     Mouth: Mucous membranes are moist.     Pharynx: Oropharynx is clear. No oropharyngeal exudate or posterior oropharyngeal erythema.  Eyes:     Conjunctiva/sclera: Conjunctivae normal.  Cardiovascular:     Rate and Rhythm: Normal rate and regular rhythm.     Heart sounds: Normal heart sounds. No murmur heard. Pulmonary:     Effort: Pulmonary effort is normal. No respiratory distress.     Breath sounds: Normal breath sounds. No wheezing,  rhonchi or rales.  Skin:    General: Skin is warm and dry.  Neurological:     Mental Status: He is alert.  Psychiatric:        Mood and Affect: Mood normal.        Thought Content: Thought content normal.      UC Treatments / Results  Labs (all labs ordered are listed, but only abnormal results are displayed) Labs Reviewed  POC COVID19/FLU A&B COMBO - Normal    EKG   Radiology No results found.  Procedures Procedures (including critical care time)  Medications Ordered in UC Medications - No data to display  Initial Impression / Assessment and Plan / UC Course  I have reviewed the triage vital signs and the nursing notes.  Pertinent labs & imaging results that were available during my care of the patient were reviewed by me and considered in my medical decision making (see chart for details).    Suspect viral etiology of upper respiratory symptoms.  COVID and flu screening negative in office.  Advised symptomatic treatment, increase fluids and rest with follow-up if no gradual improvement or with any worsening symptoms.  Final Clinical Impressions(s) / UC Diagnoses   Final diagnoses:  Acute upper respiratory infection   Discharge Instructions   None    ED Prescriptions   None    PDMP not reviewed this encounter.   Vernestine Gondola, PA-C 09/13/23 1642

## 2023-09-15 DIAGNOSIS — G4733 Obstructive sleep apnea (adult) (pediatric): Secondary | ICD-10-CM | POA: Diagnosis not present

## 2023-09-28 ENCOUNTER — Ambulatory Visit: Payer: BC Managed Care – PPO | Admitting: Family Medicine

## 2023-10-07 ENCOUNTER — Encounter: Payer: Self-pay | Admitting: Family Medicine

## 2023-10-07 ENCOUNTER — Ambulatory Visit: Payer: BC Managed Care – PPO | Admitting: Family Medicine

## 2023-10-07 DIAGNOSIS — Z6841 Body Mass Index (BMI) 40.0 and over, adult: Secondary | ICD-10-CM

## 2023-10-07 DIAGNOSIS — R7303 Prediabetes: Secondary | ICD-10-CM

## 2023-10-07 DIAGNOSIS — R030 Elevated blood-pressure reading, without diagnosis of hypertension: Secondary | ICD-10-CM | POA: Diagnosis not present

## 2023-10-07 DIAGNOSIS — G4733 Obstructive sleep apnea (adult) (pediatric): Secondary | ICD-10-CM

## 2023-10-07 NOTE — Progress Notes (Signed)
 Subjective:     Patient ID: Larry Fitzpatrick, male    DOB: 12-04-1985, 38 y.o.   MRN: [id number]  Chief Complaint  Patient presents with   Medical Management of Chronic Issues    3 month f/u    HPI  History of Present Illness         Here to follow up on chronic health conditions  Prediabetes with his last A1c 6.4%   BP has been elevated but has not checked it at home.   Using CPAP and sleeping well  [facility]      Health Maintenance Due  Topic Date Due   Pneumococcal Vaccine 46-56 Years old (1 of 2 - PCV) Never done   HIV Screening  Never done   Hepatitis C Screening  Never done   DTaP/Tdap/Td (1 - Tdap) Never done    Past Medical History:  Diagnosis Date   Asthma     History reviewed. No pertinent surgical history.  Family History  Problem Relation Age of Onset   Crohn's disease Mother    Iron deficiency Mother     Social History   Socioeconomic History   Marital status: Married    Spouse name: Not on file   Number of children: Not on file   Years of education: Not on file   Highest education level: Not on file  Occupational History   Not on file  Tobacco Use   Smoking status: Never   Smokeless tobacco: Never  Vaping Use   Vaping status: Never Used  Substance and Sexual Activity   Alcohol use: Not Currently   Drug use: Not Currently   Sexual activity: Not Currently  Other Topics Concern   Not on file  Social History Narrative   Not on file   Social Drivers of Health   Financial Resource Strain: Not on file  Food Insecurity: Not on file  Transportation Needs: Not on file  Physical Activity: Not on file  Stress: Not on file  Social Connections: Not on file  Intimate Partner Violence: Not on file    Outpatient Medications Prior to Visit  Medication Sig Dispense Refill   albuterol (VENTOLIN HFA) 108 (90 Base) MCG/ACT inhaler TAKE 2 PUFFS BY MOUTH EVERY 6 HOURS AS NEEDED FOR WHEEZE OR SHORTNESS OF BREATH 18 g 1    fluticasone-salmeterol (WIXELA INHUB) 250-50 MCG/ACT AEPB Inhale 1 puff into the lungs in the morning and at bedtime. 60 each 0   No facility-administered medications prior to visit.    Allergies  Allergen Reactions   Other Rash    Nickle     Review of Systems  Constitutional:  Negative for chills, fever, malaise/fatigue and weight loss.  Respiratory:  Negative for shortness of breath.   Cardiovascular:  Negative for chest pain, palpitations and leg swelling.  Gastrointestinal:  Negative for abdominal pain, constipation, diarrhea, nausea and vomiting.  Genitourinary:  Negative for dysuria, frequency and urgency.  Neurological:  Negative for dizziness, focal weakness and headaches.       Objective:    Physical Exam Constitutional:      General: He is not in acute distress.    Appearance: He is obese. He is not ill-appearing.  Eyes:     Extraocular Movements: Extraocular movements intact.     Conjunctiva/sclera: Conjunctivae normal.  Cardiovascular:     Rate and Rhythm: Normal rate.  Pulmonary:     Effort: Pulmonary effort is normal.  Musculoskeletal:     Cervical back: Normal  range of motion and neck supple.  Skin:    General: Skin is warm and dry.  Neurological:     General: No focal deficit present.     Mental Status: He is alert and oriented to person, place, and time.  Psychiatric:        Mood and Affect: Mood normal.        Behavior: Behavior normal.        Thought Content: Thought content normal.      BP (!) 144/96 (BP Location: Left Arm, Patient Position: Sitting)   Pulse 90   Temp 97.8 F (36.6 C) (Temporal)   Ht 5\' 5"  (1.651 m)   Wt 287 lb (130.2 kg)   SpO2 96%   BMI 47.76 kg/m  Wt Readings from Last 3 Encounters:  10/07/23 287 lb (130.2 kg)  07/15/23 284 lb (128.8 kg)  06/16/23 285 lb (129.3 kg)       Assessment & Plan:   Problem List Items Addressed This Visit     Elevated blood pressure reading in office without diagnosis of hypertension    Prediabetes   Severe obesity (BMI >= 40) (HCC) - Primary   Other Visit Diagnoses       OSA on CPAP          Prefers to hold off on rechecking labs. He will work on diet and exercise.  Monitor BP at home.  Follow up in 2 months for fasting visit.  Check CPAP compliance.   I am having Shmuel Girgis maintain his fluticasone-salmeterol and albuterol.  No orders of the defined types were placed in this encounter.

## 2023-10-07 NOTE — Patient Instructions (Signed)
 Please continue working on a healthy diet low in sodium, carbohydrates and saturated fat.  Try to get a machine that works well, fits your arm and check your blood pressure at home.  130/80 or lower is the goal for blood pressures  I will see back for a fasting follow-up in 2 months.  Do not eat breakfast that day but drink water before you come in.

## 2023-10-13 ENCOUNTER — Ambulatory Visit: Payer: BC Managed Care – PPO | Admitting: Family Medicine

## 2023-10-13 DIAGNOSIS — G4733 Obstructive sleep apnea (adult) (pediatric): Secondary | ICD-10-CM | POA: Diagnosis not present

## 2023-11-13 DIAGNOSIS — G4733 Obstructive sleep apnea (adult) (pediatric): Secondary | ICD-10-CM | POA: Diagnosis not present

## 2023-12-13 DIAGNOSIS — G4733 Obstructive sleep apnea (adult) (pediatric): Secondary | ICD-10-CM | POA: Diagnosis not present

## 2023-12-16 ENCOUNTER — Ambulatory Visit: Admitting: Family Medicine

## 2024-01-13 DIAGNOSIS — G4733 Obstructive sleep apnea (adult) (pediatric): Secondary | ICD-10-CM | POA: Diagnosis not present

## 2024-02-12 DIAGNOSIS — G4733 Obstructive sleep apnea (adult) (pediatric): Secondary | ICD-10-CM | POA: Diagnosis not present

## 2024-03-03 NOTE — Procedures (Signed)
 Larry Fitzpatrick

## 2024-03-03 NOTE — Procedures (Signed)
 SABRA

## 2024-03-07 NOTE — Procedures (Signed)
 Larry Fitzpatrick

## 2024-03-14 DIAGNOSIS — G4733 Obstructive sleep apnea (adult) (pediatric): Secondary | ICD-10-CM | POA: Diagnosis not present

## 2024-04-14 DIAGNOSIS — G4733 Obstructive sleep apnea (adult) (pediatric): Secondary | ICD-10-CM | POA: Diagnosis not present

## 2024-06-07 DIAGNOSIS — R0989 Other specified symptoms and signs involving the circulatory and respiratory systems: Secondary | ICD-10-CM | POA: Diagnosis not present

## 2024-06-07 DIAGNOSIS — Z8709 Personal history of other diseases of the respiratory system: Secondary | ICD-10-CM | POA: Diagnosis not present
